# Patient Record
Sex: Male | Born: 1980 | Race: White | Hispanic: No | Marital: Single
Health system: Southern US, Community
[De-identification: ages and names within clinical notes are randomized; demographics above are authoritative.]

---

## 2011-06-10 ENCOUNTER — Emergency Department: Payer: Self-pay | Admitting: Emergency Medicine

## 2012-03-31 ENCOUNTER — Inpatient Hospital Stay: Payer: Self-pay | Admitting: Surgery

## 2012-03-31 LAB — COMPREHENSIVE METABOLIC PANEL
Albumin: 4.2 g/dL (ref 3.4–5.0)
Alkaline Phosphatase: 66 U/L (ref 50–136)
Calcium, Total: 9.2 mg/dL (ref 8.5–10.1)
Co2: 28 mmol/L (ref 21–32)
Creatinine: 0.78 mg/dL (ref 0.60–1.30)
EGFR (African American): >60
EGFR (Non-African Amer.): >60
Glucose: 98 mg/dL (ref 65–99)
SGOT(AST): 20 U/L (ref 15–37)
Sodium: 138 mmol/L (ref 136–145)
Total Protein: 8.2 g/dL (ref 6.4–8.2)

## 2012-03-31 LAB — LIPASE, BLOOD: Lipase: 63 U/L — ABNORMAL LOW (ref 73–393)

## 2012-03-31 LAB — URINALYSIS, COMPLETE
Bilirubin,UR: NEGATIVE
Blood: NEGATIVE
Glucose,UR: NEGATIVE mg/dL (ref 0–75)
Ketone: NEGATIVE
Ph: 7 (ref 4.5–8.0)
Protein: NEGATIVE
RBC,UR: 1 /HPF (ref 0–5)
Specific Gravity: 1.032 (ref 1.003–1.030)
Squamous Epithelial: NONE SEEN

## 2012-03-31 LAB — CBC
HCT: 45.5 % (ref 40.0–52.0)
MCH: 33 pg (ref 26.0–34.0)
MCHC: 34.2 g/dL (ref 32.0–36.0)
Platelet: 222 10*3/uL (ref 150–440)
RBC: 4.72 10*6/uL (ref 4.40–5.90)
RDW: 13.1 % (ref 11.5–14.5)
WBC: 10.3 10*3/uL (ref 3.8–10.6)

## 2012-04-02 LAB — PATHOLOGY REPORT

## 2012-05-27 ENCOUNTER — Emergency Department: Payer: Self-pay | Admitting: Emergency Medicine

## 2012-10-24 IMAGING — CR DG ABDOMEN 3V
1 series · 4 of 4 positions shown · non-contrast
Comparison: none

REASON FOR EXAM: abd pain
COMMENTS:

[Series 1: pa · 0.17mm/px · 4 of 4 slices shown]
[im 1/4]
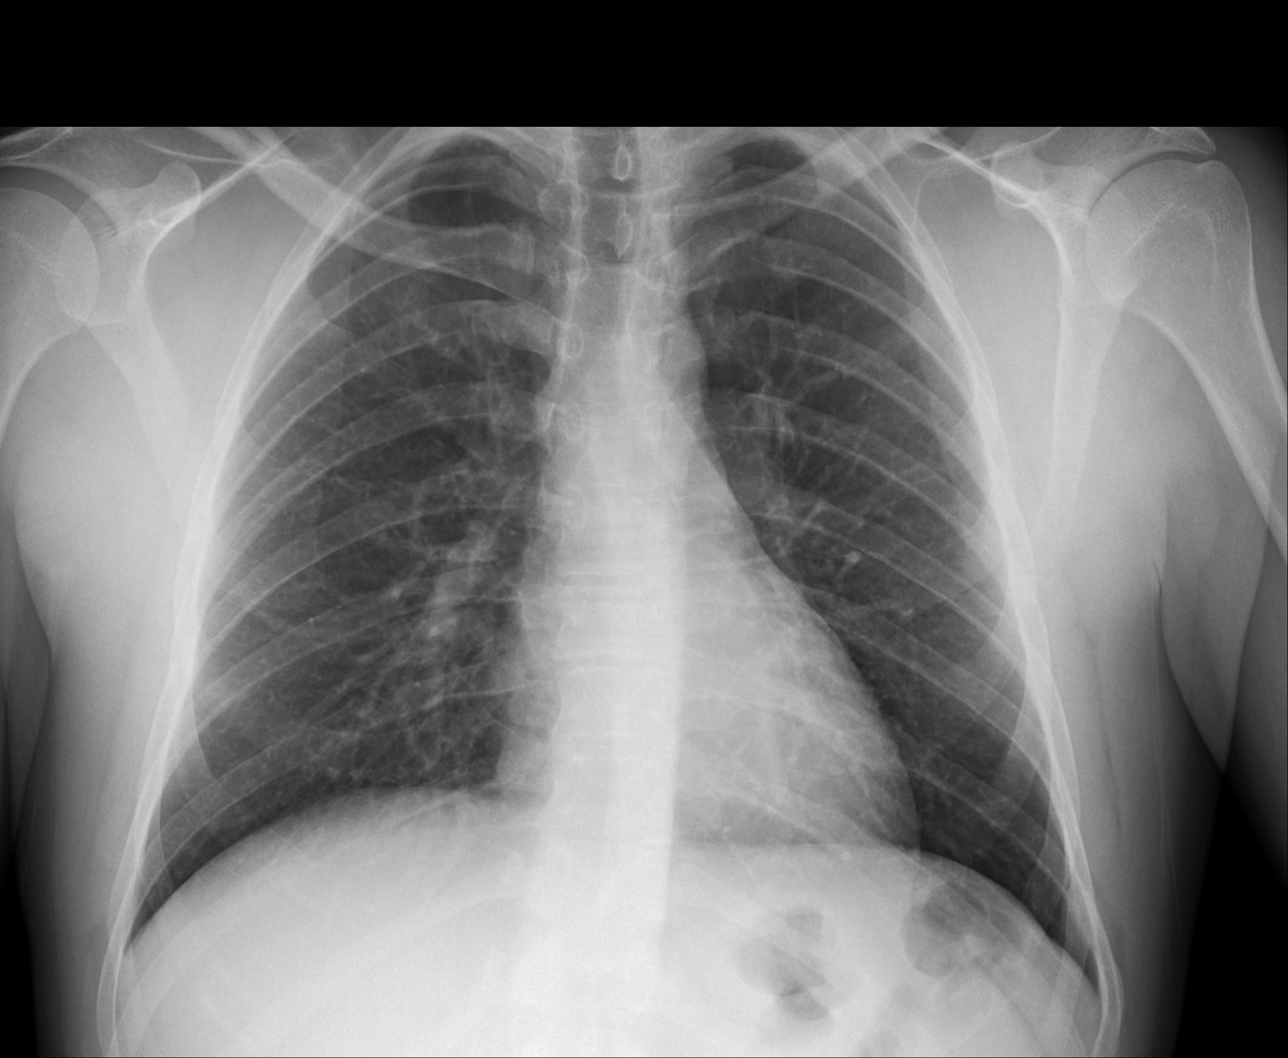
[im 2/4]
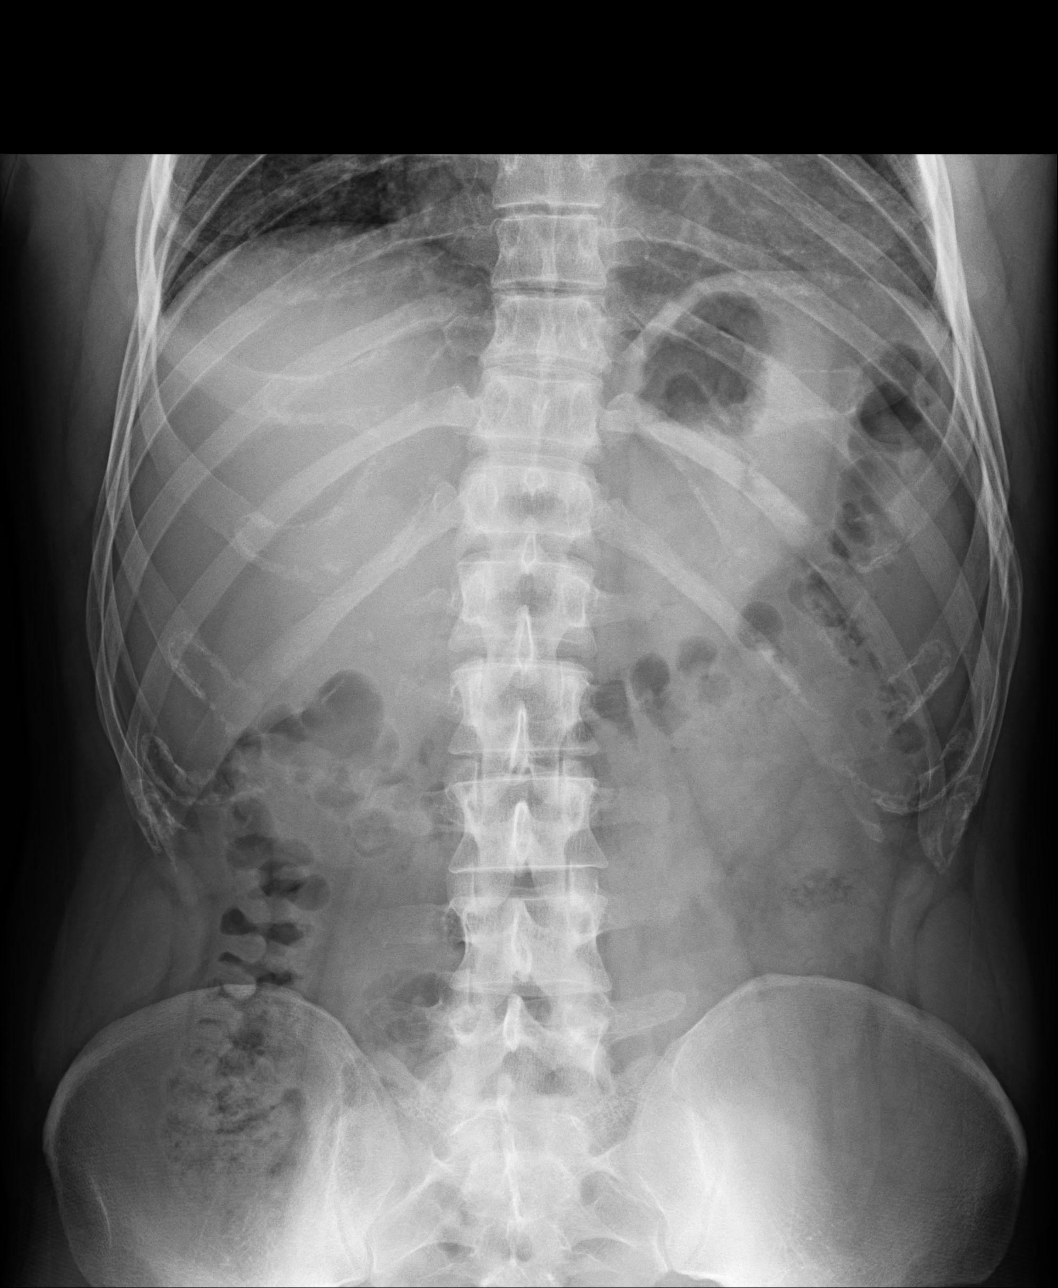
[im 3/4]
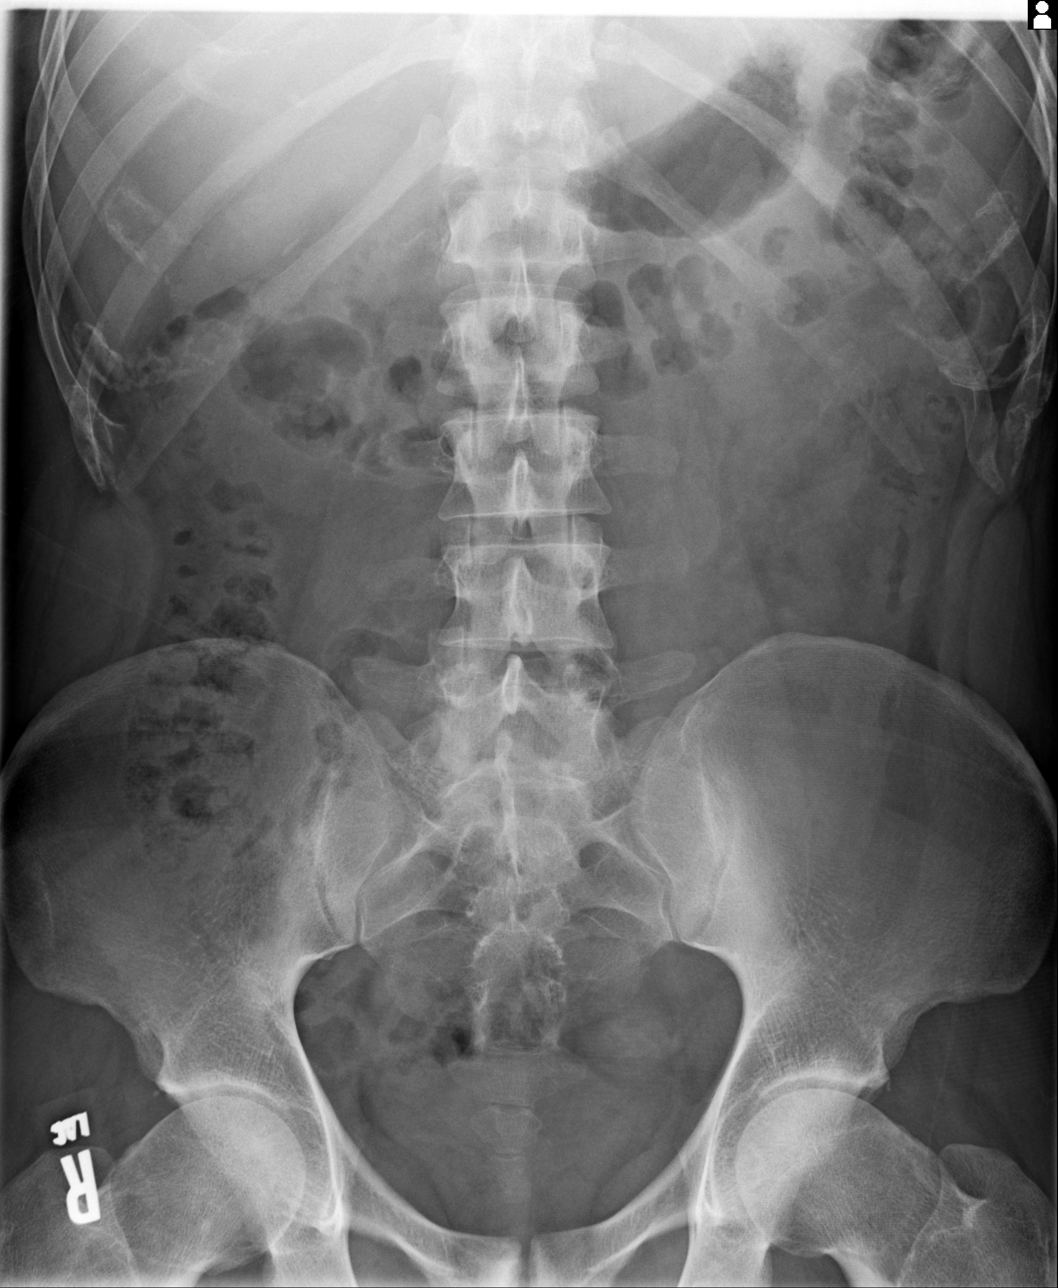
[im 4/4]
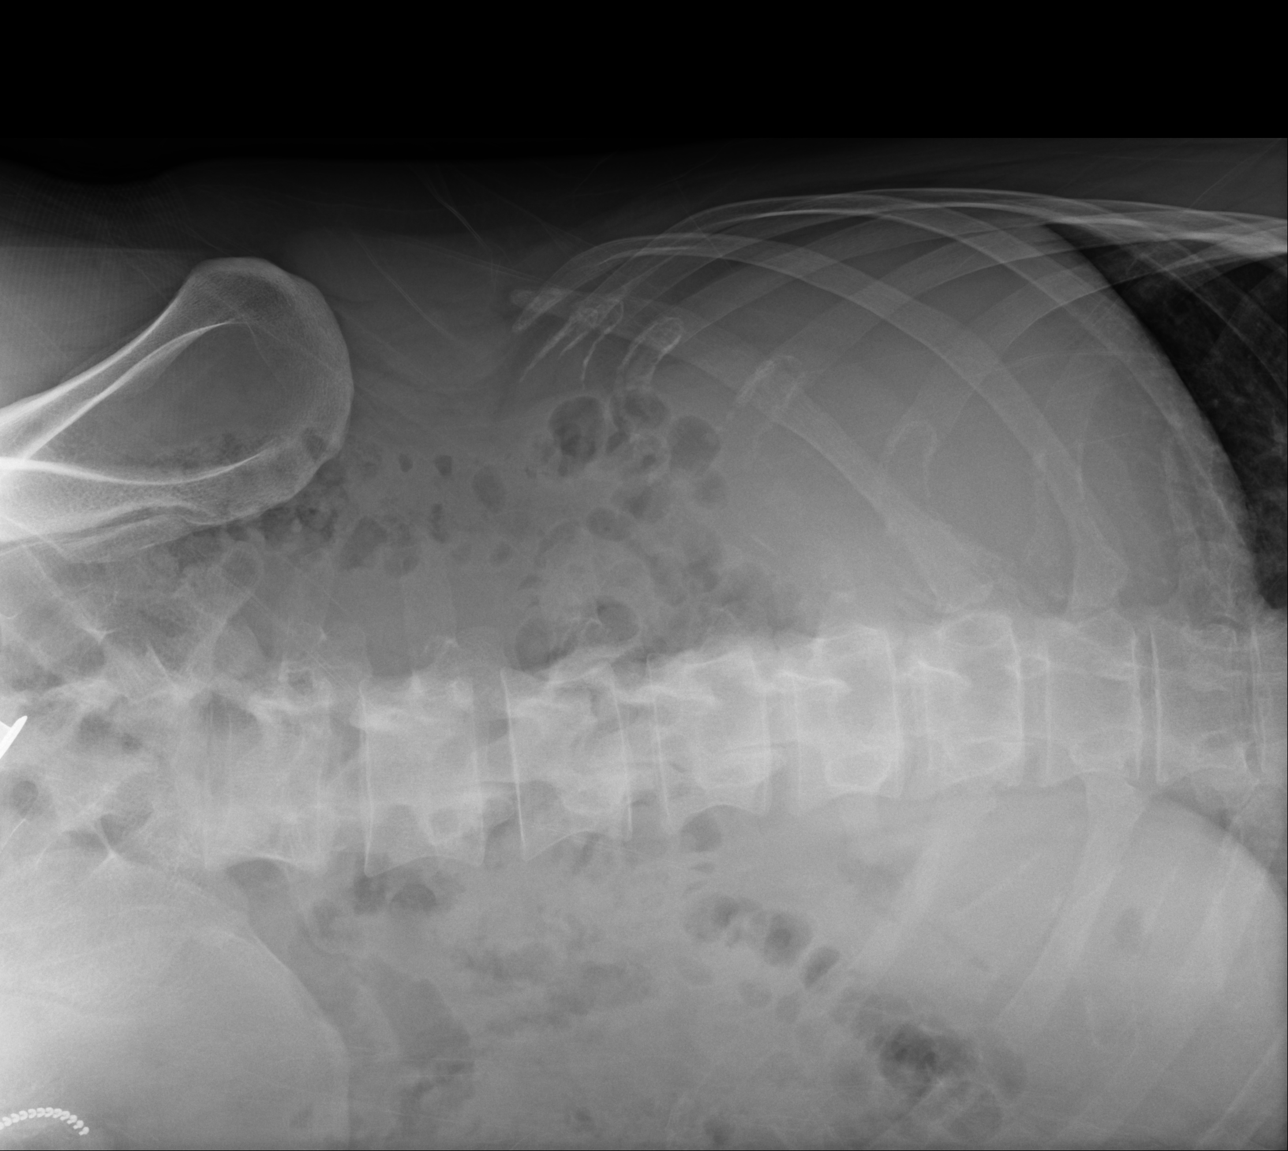

[4 of 4 positions shown; findings below may reference images not displayed]

PROCEDURE:     DXR - DXR ABDOMEN 3-WAY (INCL PA CXR)  - March 31, 2012  [DATE]

RESULT:     The lung fields are clear.

Multiple views of the abdomen were obtained. No subdiaphragmatic free air is
seen. The bowel gas pattern is normal. No findings suspicious for bowel
obstruction are noted. No abnormal intra-abdominal calcifications are
identified.
IMPRESSION: No bowel obstruction or other significant abnormality
identified.

[REDACTED]

## 2015-02-19 NOTE — Op Note (Signed)
PATIENT NAME:  Roger Webster, Roger Webster MR#:  [id number] DATE OF BIRTH:  08-15-1981  DATE OF PROCEDURE:  04/01/2012  PREOPERATIVE DIAGNOSIS: Acute calculus cholecystitis.   POSTOPERATIVE DIAGNOSIS: Acute calculus cholecystitis with gangrenous changes.   PROCEDURE PERFORMED: Laparoscopic cholecystectomy.   ATTENDING SURGEON: Kennadee Walthour A. Egbert Garibaldi, MD   ASSISTANT SURGEON: Surgical scrub technologist   TYPE OF ANESTHESIA: General endotracheal, Dr. Darleene Cleaver    INDICATIONS: This is a 34 year old white male with a one day history of severe right upper quadrant abdominal pain. Imaging with CT scan demonstrated pericholecystic fluid, gallbladder wall thickening, and clinical examination is most consistent with acute calculus cholecystitis. Liver function tests were normal. I discussed with him laparoscopic cholecystectomy, the risks of bile duct injury, leak, infection, bleeding, need for conversion to open operation, and the possibility of retained stones and all of his questions are answered.   FINDINGS: Acute early gangrenous changes in the gallbladder.   SPECIMENS: Gallbladder with contents to pathology.   ESTIMATED BLOOD LOSS: 50 mL.   DRAINS: Jackson-Pratt in gallbladder fossa.   DESCRIPTION OF PROCEDURE: With the patient in the supine position and general endotracheal anesthesia induced, perioperative antibiotics and DVT being administered. The patient's abdomen was clipped of hair, prepped and draped with ChloraPrep solution. Time-out was observed.   An infraumbilical transversely oriented skin incision was fashioned with a scalpel and carried down with sharp dissection to the abdominal midline fascia which was incised in the midline a short distance, elevated with Kocher clamps. The peritoneum was entered sharply with Metzenbaum scissors and hemostat clamps. 0 Vicryl U-stitch was passed. 12 mm blunt Hassan trocar was passed under direct visualization. Pneumoperitoneum was established. The patient was  then positioned in reverse Trendelenburg and airplane right side up. 5 mm bladeless trocars were utilized in the epigastric port site in the right subcostal margin. Gallbladder was decompressed of 120 mL of thick motor oil appearing bile. Gallbladder was grasped and elevated above the right lobe of the liver. Lateral traction was placed on Hartmann's pouch. The hepatoduodenal ligament was then incised with blunt technique and a combination of hook electrocautery through the peritoneal reflection and hydrodissection liberating a cystic duct and single cystic artery. A critical view of safety cholecystectomy was performed. The gallbladder cystic duct junction was identified.   The cystic duct was triply clipped on the portal side, singly clipped on the gallbladder side, and sharply divided. Cystic artery was doubly clipped on the portal side, singly clipped on the gallbladder side, and divided sharply. The gallbladder was then retrieved off the gallbladder fossa utilizing hook cautery apparatus with no evidence of aberrant artery or duct being seen. It was placed into an EndoCatch device and retrieved. The right upper quadrant was irrigated with a total of 2 liters of warm normal saline and aspirated dry. Point hemostasis was obtained with cautery and the application of Surgiflo with thrombin application. A 19 mm Blake drain was directed into the gallbladder fossa and exited the lowermost right upper quadrant port site. Ports were then removed under direct visualization. Pneumoperitoneum was released. The infraumbilical fascial defect was closed with a figure-of-eight #0 Vicryl suture in vertical orientation and the existing stay sutures tied to each other. A total of 30 mL of 0.25% plain Marcaine was infiltrated along all skin and fascial incisions prior to closure. 4-0 Vicryl subcuticular was applied to skin edges. Benzoin, Steri-Strips, Telfa, and Tegaderm were applied. JP was placed to bulb suction. The patient  was subsequently extubated and taken to the recovery  room in stable and satisfactory condition by anesthesia services.   ____________________________ Redge GainerMark A. Egbert GaribaldiBird, MD mab:drc D: 04/01/2012 11:32:06 ET T: 04/01/2012 12:12:17 ET JOB#: [id number]  cc: Loraine LericheMark A. Egbert GaribaldiBird, MD, <Dictator> Nettie Cromwell A [place] Lebeau MD ELECTRONICALLY SIGNED 04/02/2012 20:30

## 2015-02-19 NOTE — H&P (Signed)
PATIENT NAME:  Roger Webster, Roger Webster MR#:  [id number] DATE OF BIRTH:  [birth year]  DATE OF ADMISSION:  03/31/2012  ADMITTING DIAGNOSIS: Acute calculus cholecystitis.   HISTORY: This is a 34 year old otherwise healthy white male who presents to the Emergency Room with an approximately 24-hour history of significant epigastric and right upper quadrant abdominal pain which is severe in nature and unrelenting associated with several episodes of nausea and vomiting. One month ago the patient had a similar but less intense episode lasting approximately two hours. This then recurred approximately 12 hours after his first episode lasting another two hours. He had nausea and vomiting with both these episodes. He has a maternal grandmother who needed a cholecystectomy. The patient has had no sick contacts. No fever. No jaundice. In between these two episodes the patient has been pain free. He is thirsty. No other past medical history.   ALLERGIES: Codeine.   MEDICATIONS: Amoxicillin for local trauma of his left knee.   SOCIAL HISTORY: The patient drinks and smokes. No illicit drugs. Is employed as a Soil scientistlogger.   FAMILY HISTORY: Family history is significant only for cholelithiasis in a maternal grandmother.   PAST MEDICAL HISTORY: None.   PAST SURGICAL HISTORY: Multiple orthopedic operations. No intra-abdominal operations.   PHYSICAL EXAMINATION:   GENERAL: The patient is uncomfortable but anicteric. Facies are symmetrical.   LUNGS: Clear bilaterally.   HEART: Regular rate and rhythm. No murmurs.   VITAL SIGNS: Temperature 97 degrees, pulse 76, blood pressure 136/87, 5 feet 11 inches, 180 pounds, BMI 25.1.   ABDOMEN: Abdomen demonstrates no scars. No hernias. There is significant tenderness in the epigastrium and focal peritoneal signs in the right upper quadrant consistent with a positive Murphy sign. No obvious hernias.   EXTREMITIES: Warm and well perfused.   RECTAL/GENITOURINARY: Deferred.    LABORATORY, DIAGNOSTIC, AND RADIOLOGICAL DATA: Urinalysis negative. Glucose 98, BUN 7, sodium 138, creatinine 0.78, potassium 3.8, chloride 103. Lipase is 63. Liver function tests are normal. White count 10.3, hemoglobin 15.6, hematocrit 45.5, platelet count 222,000.   Review of CT scan was personally performed. There is evidence of pericholecystic fluid, gallbladder wall thickening, and gallbladder distention. Small bowel and large bowel are unremarkable. Appendix is normal.   IMPRESSION: This is a 34 year old white male with acute calculus cholecystitis.   PLAN:  1. Admission. 2. Hydration. 3. Intravenous antibiotics. 4. N.p.o. after midnight.   5. Will proceed with laparoscopic cholecystectomy tomorrow during the regular shift of the OR. I discussed with him the procedure briefly and I will discuss more with him in the morning. All of his questions are answered.   TOTAL TIME SPENT: 45 minutes.  ____________________________ Redge GainerMark A. Egbert GaribaldiBird, MD mab:drc D: 03/31/2012 18:03:58 ET T: 04/01/2012 05:56:59 ET JOB#: [id number]  cc: Loraine LericheMark A. Egbert GaribaldiBird, MD, <Dictator> Cartel Mauss A Octavio Matheney MD ELECTRONICALLY SIGNED 04/02/2012 20:29

## 2024-07-28 DEATH — deceased
